# Patient Record
Sex: Male | Born: 1975 | Race: Black or African American | Hispanic: No | Marital: Single | Smoking: Never smoker
Health system: Southern US, Community
[De-identification: ages and names within clinical notes are randomized; demographics above are authoritative.]

## PROBLEM LIST (undated history)

## (undated) HISTORY — PX: NO PAST SURGERIES: SHX2092

---

## 2004-11-09 ENCOUNTER — Emergency Department (HOSPITAL_COMMUNITY): Admission: EM | Admit: 2004-11-09 | Discharge: 2004-11-09 | Payer: Self-pay | Admitting: Emergency Medicine

## 2018-08-10 ENCOUNTER — Ambulatory Visit (INDEPENDENT_AMBULATORY_CARE_PROVIDER_SITE_OTHER): Admitting: Urology

## 2018-08-10 ENCOUNTER — Encounter: Payer: Self-pay | Admitting: Urology

## 2018-08-10 ENCOUNTER — Telehealth: Payer: Self-pay | Admitting: Urology

## 2018-08-10 VITALS — BP 146/95 | HR 65 | Ht 71.0 in | Wt 208.7 lb

## 2018-08-10 DIAGNOSIS — Z3009 Encounter for other general counseling and advice on contraception: Secondary | ICD-10-CM

## 2018-08-10 MED ORDER — DIAZEPAM 10 MG PO TABS: 10.0000 mg | ORAL_TABLET | Freq: Four times a day (QID) | ORAL | 0 refills | Status: DC | PRN

## 2018-08-10 NOTE — Progress Notes (Signed)
08/10/2018 12:56 PM   Joshua Davidson 07/12/1966 [id number]  Referring provider: Bernerd Limbo, MD [address], [place] [address]  CC: Desire for vasectomy  HPI: I saw Joshua Davidson today in urology clinic in consultation for vasectomy from Dr. Coletta Memos.  He is a healthy 42 year old African-American male.  He is married with 4 children including a newborn.  Both he and his wife do not desire any further pregnancies.  He denies any history of urinary symptoms, erectile dysfunction, or gross hematuria.  He works in [employer].  He denies any prior surgeries.  There are no aggravating or alleviating factors.   PMH: No past medical history on file.  Surgical History: None  Home Medications:  Allergies as of 08/10/2018   No Known Allergies     Medication List    as of 08/10/2018 12:56 PM   You have not been prescribed any medications.     Allergies: No Known Allergies  Family History: No family history on file.  Social History:  reports that he has never smoked. He has never used smokeless tobacco. He reports that he drank alcohol. He reports that he does not use drugs.  ROS: Please see flowsheet from today's date for complete review of systems.  Physical Exam: BP (!) 146/95 (BP Location: Left Arm, Patient Position: Sitting, Cuff Size: Large)   Pulse 65   Ht 5' 11"  (1.803 m)   Wt 208 lb 11.2 oz (94.7 kg)   BMI 29.11 kg/m    Constitutional:  Alert and oriented, No acute distress. Cardiovascular: No clubbing, cyanosis, or edema. Respiratory: Normal respiratory effort, no increased work of breathing. GI: Abdomen is soft, nontender, nondistended, no abdominal masses GU: No CVA tenderness, phallus without lesions, widely patent meatus Left varicocele.  Testicles descended and 20 cc bilaterally without masses.  Vas deferens palpable bilaterally Lymph: No cervical or inguinal lymphadenopathy. Skin: No rashes, bruises or suspicious lesions. Neurologic:  Grossly intact, no focal deficits, moving all 4 extremities. Psychiatric: Normal mood and affect.  Laboratory Data: None to review  Pertinent Imaging: None to review  Assessment & Plan:   In summary, Joshua Davidson is a healthy 42 year old male with 4 children who desires vasectomy.  We discussed the risks and benefits of vasectomy at length.  Vasectomy is intended to be a permanent form of contraception, and does not produce immediate sterility.  Following vasectomy another form of contraception is required until vas occlusion is confirmed by a post-vasectomy semen analysis obtained 2-3 months after the procedure.  Even after vas occlusion is confirmed, vasectomy is not 100% reliable in preventing pregnancy, and the failure rate is approximately 09/1998.  Repeat vasectomy is required in less than 1% of patients.  He should refrain from ejaculation for 1 week after vasectomy.  Options for fertility after vasectomy include vasectomy reversal, and sperm retrieval with in vitro fertilization or ICSI.  These options are not always successful and may be expensive.  Finally, there are other permanent and non-permanent alternatives to vasectomy available. There is no risk of erectile dysfunction, and the volume of semen will be similar to prior, as the majority of the ejaculate is from the prostate and seminal vesicles.   The procedure takes ~20 minutes.  We recommend patients take 5-10 mg of Valium 30 minutes prior, and he will need a driver post-procedure.  Local anesthetic is injected into the scrotal skin and a small segment of the vas deferens is removed, and the ends occluded. The  complication rate is approximately 1-2%, and includes bleeding, infection, and development of chronic scrotal pain.  PLAN: Pending insurance approval, will schedule for vasectomy at his convenience   A total of 30 minutes were spent face-to-face with the patient, greater than 50% was spent in patient education, counseling, and  coordination of care regarding vasectomy and peri-and post procedural expectations.  Billey Co, [facility] [address] [place], [place] [address] [phone]

## 2018-08-10 NOTE — Telephone Encounter (Signed)
Spoke to patient and RX is up front

## 2018-08-10 NOTE — Telephone Encounter (Signed)
Rx cannot be e-scribed. It is up front for him to pick up. Thanks

## 2018-08-10 NOTE — Telephone Encounter (Signed)
Pt scheduled vasectomy for 09/15/18 and would like a valium sent to [facility] on [address] in [place].

## 2018-09-15 ENCOUNTER — Encounter: Payer: Self-pay | Admitting: Urology

## 2018-09-15 ENCOUNTER — Ambulatory Visit (INDEPENDENT_AMBULATORY_CARE_PROVIDER_SITE_OTHER): Admitting: Urology

## 2018-09-15 VITALS — BP 147/101 | HR 70 | Wt 210.0 lb

## 2018-09-15 DIAGNOSIS — Z302 Encounter for sterilization: Secondary | ICD-10-CM

## 2018-09-15 NOTE — Progress Notes (Signed)
VASECTOMY PROCEDURE NOTE:  The patient was taken to the minor procedure room and placed in the supine position. His genitals were prepped and draped in the usual sterile fashion. The right vas deferens was brought up to the skin of the right upper scrotum. The skin overlying it was anesthetized with 1% lidocaine without epinephrine, anesthetic was also injected alongside the vas deferens in the direction of the inguinal canal. The no scalpel vasectomy instrument was used to make a small perforation in the scrotal skin. The vasectomy clamp was used to grasp the vas deferens. It was carefully dissected free from surrounding structures. A 1cm segment of the vas was removed, and the cut ends of the mucosa were cauterized. No significant bleeding was noted. The vas deferens was returned to the scrotum. The skin incision was closed with a simple interrupted stitch of 4-0 chromic.  Attention was then turned to the left side. The left vasectomy was performed in the same exact fashion. Sterile dressings were placed over each incision. The patient tolerated the procedure well.  IMPRESSION/DIAGNOSIS: The patient is a 43 year old gentleman who underwent a vasectomy today. Post-procedure instructions were reviewed. I stressed the importance of continuing to use birth control until he provides a semen specimen more than 2 months from now that demonstrates azoospermia.  We discussed return precautions including fever over 101, significant bleeding or hematoma, or uncontrolled pain. I also stressed the importance of avoiding strenuous activity for one week, no sexual activity or ejaculations for 5 days, intermittent icing over the next 48 hours, and scrotal support.   PLAN: The patient will be advised of his semen analysis results when available.  Nickolas Madrid, MD 09/15/2018

## 2018-09-26 ENCOUNTER — Emergency Department (HOSPITAL_COMMUNITY)
Admission: EM | Admit: 2018-09-26 | Discharge: 2018-09-27 | Disposition: A | Discharge status: Home / Self Care | Attending: Emergency Medicine | Admitting: Emergency Medicine

## 2018-09-26 ENCOUNTER — Other Ambulatory Visit: Payer: Self-pay

## 2018-09-26 ENCOUNTER — Emergency Department (HOSPITAL_COMMUNITY)

## 2018-09-26 ENCOUNTER — Encounter (HOSPITAL_COMMUNITY): Payer: Self-pay

## 2018-09-26 DIAGNOSIS — G8918 Other acute postprocedural pain: Secondary | ICD-10-CM | POA: Diagnosis not present

## 2018-09-26 DIAGNOSIS — N50812 Left testicular pain: Secondary | ICD-10-CM | POA: Diagnosis present

## 2018-09-26 LAB — COMPREHENSIVE METABOLIC PANEL
ALT: 36 U/L (ref 0–44)
AST: 27 U/L (ref 15–41)
Albumin: 4.4 g/dL (ref 3.5–5.0)
Alkaline Phosphatase: 71 U/L (ref 38–126)
Anion gap: 7 (ref 5–15)
BUN: 17 mg/dL (ref 6–20)
CHLORIDE: 102 mmol/L (ref 98–111)
CO2: 29 mmol/L (ref 22–32)
Calcium: 9.3 mg/dL (ref 8.9–10.3)
Creatinine, Ser: 1.15 mg/dL (ref 0.61–1.24)
GFR calc Af Amer: >60 mL/min (ref >60)
GFR calc non Af Amer: >60 mL/min (ref >60)
Glucose, Bld: 83 mg/dL (ref 70–99)
Potassium: 3.8 mmol/L (ref 3.5–5.1)
Sodium: 138 mmol/L (ref 135–145)
Total Bilirubin: 1.4 mg/dL — ABNORMAL HIGH (ref 0.3–1.2)
Total Protein: 7.6 g/dL (ref 6.5–8.1)

## 2018-09-26 LAB — CBC WITH DIFFERENTIAL/PLATELET
Abs Immature Granulocytes: 0.03 10*3/uL (ref 0.00–0.07)
Basophils Absolute: 0 10*3/uL (ref 0.0–0.1)
Basophils Relative: 1 %
Eosinophils Absolute: 0 10*3/uL (ref 0.0–0.5)
Eosinophils Relative: 0 %
HCT: 42.5 % (ref 39.0–52.0)
Hemoglobin: 12.9 g/dL — ABNORMAL LOW (ref 13.0–17.0)
Immature Granulocytes: 0 %
Lymphocytes Relative: 17 %
Lymphs Abs: 1.5 10*3/uL (ref 0.7–4.0)
MCH: 28.7 pg (ref 26.0–34.0)
MCHC: 30.4 g/dL (ref 30.0–36.0)
MCV: 94.7 fL (ref 80.0–100.0)
Monocytes Absolute: 0.9 10*3/uL (ref 0.1–1.0)
Monocytes Relative: 11 %
Neutro Abs: 6.2 10*3/uL (ref 1.7–7.7)
Neutrophils Relative %: 71 %
Platelets: 263 10*3/uL (ref 150–400)
RBC: 4.49 MIL/uL (ref 4.22–5.81)
RDW: 13 % (ref 11.5–15.5)
WBC: 8.7 10*3/uL (ref 4.0–10.5)
nRBC: 0 % (ref 0.0–0.2)

## 2018-09-26 LAB — URINALYSIS, ROUTINE W REFLEX MICROSCOPIC
Bilirubin Urine: NEGATIVE
Glucose, UA: NEGATIVE mg/dL
Hgb urine dipstick: NEGATIVE
KETONES UR: NEGATIVE mg/dL
Leukocytes, UA: NEGATIVE
Nitrite: NEGATIVE
Protein, ur: NEGATIVE mg/dL
Specific Gravity, Urine: 1.027 (ref 1.005–1.030)
pH: 5 (ref 5.0–8.0)

## 2018-09-26 LAB — LACTIC ACID, PLASMA: LACTIC ACID, VENOUS: 0.9 mmol/L (ref 0.5–1.9)

## 2018-09-26 NOTE — ED Provider Notes (Signed)
[facility] DEPT Provider Note   CSN: [id number] Arrival date & time: 09/26/18  2110     History   Chief Complaint Chief Complaint  Patient presents with  . Post-op Problem    HPI Joshua Davidson is a 43 y.o. male.  43 year old male presents to the emergency department for evaluation of scrotal discomfort.  He is status post vasectomy on 09/15/2018.  Applied ice for approximately 24 hours postop, has not had any strenuous activity or heavy lifting, but has noticed some increasing discomfort in his left hemiscrotum.  Discomfort constant and associated with some swelling.  He has not taken any medications for symptoms.  The patient has felt chills with no documented fever. No sexual intercourse since his procedure. He further denies dysuria, problems voiding, redness or purulence from the area, abdominal pain, vomiting.  Urologist - Dr. Diamantina Providence  The history is provided by the patient. No language interpreter was used.    History reviewed. No pertinent past medical history.  There are no active problems to display for this patient.   Past Surgical History:  Procedure Laterality Date  . NO PAST SURGERIES          Home Medications    Prior to Admission medications   Medication Sig Start Date End Date Taking? Authorizing Provider  diazepam (VALIUM) 10 MG tablet Take 1 tablet (10 mg total) by mouth every 6 (six) hours as needed for anxiety. 08/10/18   Billey Co, MD  doxycycline (VIBRAMYCIN) 100 MG capsule Take 1 capsule (100 mg total) by mouth 2 (two) times daily. One po bid x 7 days 09/27/18   Sherwood Gambler, MD    Family History History reviewed. No pertinent family history.  Social History Social History   Tobacco Use  . Smoking status: Never Smoker  . Smokeless tobacco: Never Used  Substance Use Topics  . Alcohol use: Not Currently    Frequency: Never  . Drug use: Never     Allergies   Patient has no known  allergies.   Review of Systems Review of Systems Ten systems reviewed and are negative for acute change, except as noted in the HPI.    Physical Exam Updated Vital Signs BP (!) 137/92 (BP Location: Left Arm)   Pulse (!) 107   Temp (!) 102.2 F (39 C) (Oral)   Resp 18   Ht 5' 11"  (1.803 m)   Wt 95.3 kg   SpO2 97%   BMI 29.29 kg/m   Physical Exam Vitals signs and nursing note reviewed.  Constitutional:      General: He is not in acute distress.    Appearance: He is well-developed. He is not diaphoretic.     Comments: Nontoxic appearing and in NAD  HENT:     Head: Normocephalic and atraumatic.  Eyes:     General: No scleral icterus.    Conjunctiva/sclera: Conjunctivae normal.  Neck:     Musculoskeletal: Normal range of motion.  Pulmonary:     Effort: Pulmonary effort is normal. No respiratory distress.     Comments: Respirations even and unlabored Genitourinary:    Comments: Exam chaperoned by Glennon Mac, Engineer, manufacturing. There is firmness to the superior left hemiscrotum with mild discomfort on palpation. Suspect hematoma. Does not appear to communicate with testicle. No fluctuance, erythema, heat to touch. Surgical incision sites appear well healed. No purulent drainage. Right hemiscrotum and surgical site appear normal. Musculoskeletal: Normal range of motion.  Skin:    General: Skin is  warm and dry.     Coloration: Skin is not pale.     Findings: No erythema or rash.  Neurological:     Mental Status: He is alert and oriented to person, place, and time.  Psychiatric:        Behavior: Behavior normal.      ED Treatments / Results  Labs (all labs ordered are listed, but only abnormal results are displayed) Labs Reviewed  COMPREHENSIVE METABOLIC PANEL - Abnormal; Notable for the following components:      Result Value   Total Bilirubin 1.4 (*)    All other components within normal limits  CBC WITH DIFFERENTIAL/PLATELET - Abnormal; Notable for the following components:    Hemoglobin 12.9 (*)    All other components within normal limits  LACTIC ACID, PLASMA  URINALYSIS, ROUTINE W REFLEX MICROSCOPIC    EKG None  Radiology US Scrotum  Result Date: 09/26/2018 CLINICAL DATA:  Left-sided scrotal pain recent cecectomy EXAM: ULTRASOUND OF SCROTUM TECHNIQUE: Complete ultrasound examination of the testicles, epididymis, and other scrotal structures was performed. COMPARISON:  None. FINDINGS: Right testicle Measurements: 3.7 x 2.3 x 2.6 cm. No mass or microlithiasis visualized. Left testicle Measurements: 3.7 x 2.8 x 2.7 cm. No mass or microlithiasis visualized. Right epididymis:  Normal in size and appearance. Left epididymis:  Slightly enlarged and heterogeneous in appearance. Hydrocele: Small bilateral hydroceles, slightly complex on the right. Varicocele:  Small left-sided varicocele IMPRESSION: 1. Slightly enlarged heterogeneous left epididymis, possible mild epididymitis. 2. Small bilateral hydroceles 3. Small left varicocele Electronically Signed   By: Donavan Foil M.D.   On: 09/26/2018 23:17    Procedures Procedures (including critical care time)  Medications Ordered in ED Medications  ibuprofen (ADVIL,MOTRIN) tablet 600 mg (600 mg Oral Given 09/27/18 0053)  doxycycline (VIBRA-TABS) tablet 100 mg (100 mg Oral Given 09/27/18 0053)     Initial Impression / Assessment and Plan / ED Course  I have reviewed the triage vital signs and the nursing notes.  Pertinent labs & imaging results that were available during my care of the patient were reviewed by me and considered in my medical decision making (see chart for details).     43 year old male presents to the emergency department for evaluation of subjective fever and chills.  Initially afebrile prior to arrival.  Noted to develop fever of 102.2 F at time of discharge.  Was given ibuprofen for this.  Is status post vasectomy on 09/15/2018.  Began noticing increasing discomfort in his left hemiscrotum.  On  exam, there is an area of firmness and induration without fluctuance.  No overlying erythema or heat to touch.  It does not appear to specifically communicate with his surgical incision site.  Exam favors hematoma, though ultrasound raises possibility for epididymitis.  Given fever, doxycycline initiated by my attending Dr. Regenia Skeeter.  His laboratory evaluation has been reassuring without leukocytosis or elevated lactate.  No bacteriuria or pyuria on urinalysis.  It is also possible that the patient's fever may be unrelated to his scrotal discomfort such as with a flulike illness or other viral etiology.  The patient has been encouraged to call the office of his urologist in the morning to schedule close follow-up.  Return precautions discussed and provided. Patient discharged in stable condition with no unaddressed concerns.   Final Clinical Impressions(s) / ED Diagnoses   Final diagnoses:  Post-operative pain    ED Discharge Orders         Ordered    doxycycline (VIBRAMYCIN)  100 MG capsule  2 times daily     09/27/18 0025           Antonietta Breach, PA-C 09/27/18 0454    Sherwood Gambler, MD 09/27/18 [phone]

## 2018-09-26 NOTE — ED Triage Notes (Signed)
Pt reports that he had a vasectomy on 1/15. He reports noticing swelling and increased pain in his scrotum yesterday. Pt also reports feeling chills starting yesterday. Denies fever, nausea or vomiting.

## 2018-09-26 NOTE — Discharge Instructions (Signed)
We recommend scrotal support and elevation as well as continued icing.  Take ibuprofen every 6 hours as needed for discomfort.  Follow-up with your urologist for reevaluation of the affected area.  You may return to the ED for new or concerning symptoms.

## 2018-09-27 MED ORDER — IBUPROFEN 200 MG PO TABS
600.0000 mg | ORAL_TABLET | Freq: Once | ORAL | Status: AC
  Administered 2018-09-27: 600 mg via ORAL
  Filled 2018-09-27: qty 3

## 2018-09-27 MED ORDER — DOXYCYCLINE HYCLATE 100 MG PO TABS
100.0000 mg | ORAL_TABLET | Freq: Once | ORAL | Status: AC
  Administered 2018-09-27: 100 mg via ORAL
  Filled 2018-09-27: qty 1

## 2018-09-27 MED ORDER — DOXYCYCLINE HYCLATE 100 MG PO CAPS: 100.0000 mg | ORAL_CAPSULE | Freq: Two times a day (BID) | ORAL | 0 refills | Status: AC

## 2018-09-27 NOTE — ED Notes (Signed)
Dr. Regenia Skeeter aware of temp previously charted and is at bedside with pt now.

## 2018-09-27 NOTE — ED Provider Notes (Signed)
Medical screening examination/treatment/procedure(s) were conducted as a shared visit with non-physician practitioner(s) and myself.  I personally evaluated the patient during the encounter.  None   Patient presents with swelling and firmness to his left hemiscrotum.  Mildly tender but not erythematous or warm.  WBC normal, lactate normal.  Ultrasound without obvious significant findings such as abscess or fluid collection.  He was to be discharged with supportive care and follow-up with his urologist tomorrow.  He then developed a temp of 102.  He still appears well, but I think at this point it would be reasonable to give him antibiotics.  He has no other signs or symptoms of infection such as cough.   Sherwood Gambler, MD 09/27/18 [phone]

## 2018-09-28 ENCOUNTER — Encounter: Payer: Self-pay | Admitting: Urology

## 2018-09-28 ENCOUNTER — Ambulatory Visit (INDEPENDENT_AMBULATORY_CARE_PROVIDER_SITE_OTHER): Admitting: Urology

## 2018-09-28 VITALS — BP 145/93 | HR 70 | Ht 71.0 in | Wt 214.0 lb

## 2018-09-28 DIAGNOSIS — L03818 Cellulitis of other sites: Secondary | ICD-10-CM

## 2018-09-29 NOTE — Progress Notes (Signed)
   09/28/2018 8:10 AM   Roslynn Amble Oct 05, 1975 [id number]  Reason for visit: Follow up post-vasectomy concerns  Mr. Dugo is a healthy 43 year old African-American male that underwent uncomplicated vasectomy on 09/15/2018.  He did well the first week post procedure, however then developed some left-sided scrotal pain and presented to the ER in the evening of 09/26/2018 with scrotal pain.  Work-up at that time with labs was benign, and scrotal ultrasound was essentially normal aside from possible left epididymitis.  No hematoma or abscess was seen.  He reportedly had a fever in the emergency department.  He was started on a weeklong course of doxycycline.  He reports he is felt better since starting the antibiotic in the last 48 hours.  He denies any recurrent fevers, drainage, erythema, nausea, or vomiting.  On exam, incisions are well-healed, there is no purulence or erythema, there is some thickening of the left scrotal skin surrounding the left-sided vasectomy incision concerning for possible superficial cellulitis.  Testicles are nontender bilaterally.   In summary, the patient is a 43 year old healthy male with likely superficial cellulitis of the left scrotum after vasectomy.  He is improving with oral doxycycline.  I discussed the importance of ongoing scrotal support, icing, rest, and completing his 1 week course of antibiotics.  Anticipate this will resolve over the next few days when he completes his course of antibiotics.  I encouraged him to call the clinic if he has worsening scrotal pain or fever.  Keep scheduled follow-up for semen analysis in 2 to 3 months Continue doxycycline x7 days, call if symptoms worsening  A total of 10 minutes were spent face-to-face with the patient, greater than 50% was spent in patient education, counseling, and coordination of care regarding postvasectomy concerns, and scrotal cellulitis.   Billey Co, [facility] [address], [place], [place] [address] [phone]

## 2018-12-03 ENCOUNTER — Other Ambulatory Visit: Payer: Self-pay

## 2018-12-03 DIAGNOSIS — Z3009 Encounter for other general counseling and advice on contraception: Secondary | ICD-10-CM

## 2018-12-09 ENCOUNTER — Other Ambulatory Visit

## 2018-12-09 ENCOUNTER — Encounter: Payer: Self-pay | Admitting: Urology

## 2018-12-10 ENCOUNTER — Other Ambulatory Visit

## 2018-12-10 LAB — POST-VAS SPERM EVALUATION,QUAL: Volume: 1.5 mL

## 2018-12-15 ENCOUNTER — Telehealth: Payer: Self-pay

## 2018-12-15 NOTE — Telephone Encounter (Signed)
-----   Message from Billey Co, MD sent at 12/13/2018 10:02 AM EDT ----- Sperm still present, encourage ejaculations to clear passage of sperm. Needs to repeat the more sensitive semen analysis in ~4-6 weeks.  Nickolas Madrid, MD 12/13/2018

## 2018-12-15 NOTE — Telephone Encounter (Signed)
Patient notified and order form filled and placed at front desk w/cup

## 2019-02-14 ENCOUNTER — Other Ambulatory Visit: Payer: Self-pay | Admitting: Urology

## 2019-02-15 LAB — POST-VAS SPERM EVALUATION,QUAL: Volume: 2.5 mL

## 2019-02-15 LAB — SPECIMEN STATUS REPORT

## 2019-02-22 ENCOUNTER — Telehealth: Payer: Self-pay

## 2019-02-22 NOTE — Telephone Encounter (Signed)
This was his 2nd post vas drop off, should he now do the time sensitive one?

## 2019-02-22 NOTE — Telephone Encounter (Signed)
-----   Message from Billey Co, MD sent at 02/21/2019  3:51 PM EDT ----- Did this patient get the more sensitive sperm analysis test? Or just 2 of the standard semen analyses?  Nickolas Madrid, MD 02/21/2019

## 2019-02-22 NOTE — Telephone Encounter (Signed)
Yes, the more sensitive one. He had an uncomplicated vasectomy, so I anticipate that one will show no motile sperm. Also encourage clearance of sperm w/ejaculations  Nickolas Madrid, MD 02/22/2019

## 2019-03-02 NOTE — Telephone Encounter (Signed)
-----   Message from Billey Co, MD sent at 02/24/2019 12:35 PM EDT ----- Regarding: sensitive semen analysis Did this patient get the more sensitive semen analysis yet?  Thanks Nickolas Madrid, MD 02/24/2019  ----- Message ----- From: Interface, [facility] Lab Results In Sent: 02/21/2019   3:47 PM EDT To: Billey Co, MD

## 2019-03-10 ENCOUNTER — Telehealth: Payer: Self-pay | Admitting: Urology

## 2019-03-10 NOTE — Telephone Encounter (Signed)
Pt states that he did a formal semen analysis at [facility] a couple of weeks ago, he wasn't sure if we would call him back with those results or [facility]. Please advise.

## 2019-03-10 NOTE — Telephone Encounter (Signed)
Spoke with patient and notified him that the lab ran the incorrect test. They state that the test ordered is discontinued now and we have a call into our [facility] rep to resolve this. We will contact patient when we have a answer for the test to be repeated

## 2019-03-24 NOTE — Telephone Encounter (Signed)
Spoke with patient and notified him that the lab has confirmed that the order is still in place and the confusion has been resolved and is ok to reorder for the timed drop off post vas. Order placed up front for patient to pick up

## 2019-04-20 ENCOUNTER — Other Ambulatory Visit: Payer: Self-pay | Admitting: Urology

## 2019-04-21 LAB — POST VAS SEMEN ANALYSIS, AUA
Total Immotile Sperm: 0.03 x10E6/mL (ref <0.10)
Volume: 2.3 mL

## 2019-05-02 ENCOUNTER — Telehealth: Payer: Self-pay | Admitting: Urology

## 2019-05-02 NOTE — Telephone Encounter (Signed)
Pt called office asking about results for formal semen analysis that he did a couple of weeks ago.

## 2019-05-02 NOTE — Telephone Encounter (Signed)
Patient informed-verbalized understanding. Will call him next week.

## 2019-05-02 NOTE — Telephone Encounter (Signed)
Joshua Davidson was trying to clarify with [facility] bc it was reported in an odd way. But there were no mobile sperm seen per the report. Just tell him we will get back to him next week when she is back from vacation for 100% confirmation that he can stop using alternative contraception.  Nickolas Madrid, MD 05/02/2019

## 2019-05-13 ENCOUNTER — Other Ambulatory Visit: Payer: Self-pay | Admitting: Urology

## 2019-05-13 NOTE — Telephone Encounter (Signed)
Spoke with Joshua Davidson at [facility] and confirmed that the motile sperm was negative and requested a new report be sent over confirming this. Patient was notified on Vmail per DPR that semen analysis was clear no motile sperm seen and non motile was less than 100,000 per AUA guidelines ok to use VAS as primary source of contraception

## 2019-07-01 IMAGING — US US SCROTUM
1 series · 14 of 25 positions shown · non-contrast
Comparison: None.

CLINICAL DATA: Left-sided scrotal pain recent cecectomy

EXAM:
ULTRASOUND OF SCROTUM
TECHNIQUE: Complete ultrasound examination of the testicles, epididymis, and
other scrotal structures was performed.

[Series 1: us scrotum · 0.07mm/px · 29 acquisitions, 14 frames shown]
[im 1/29]
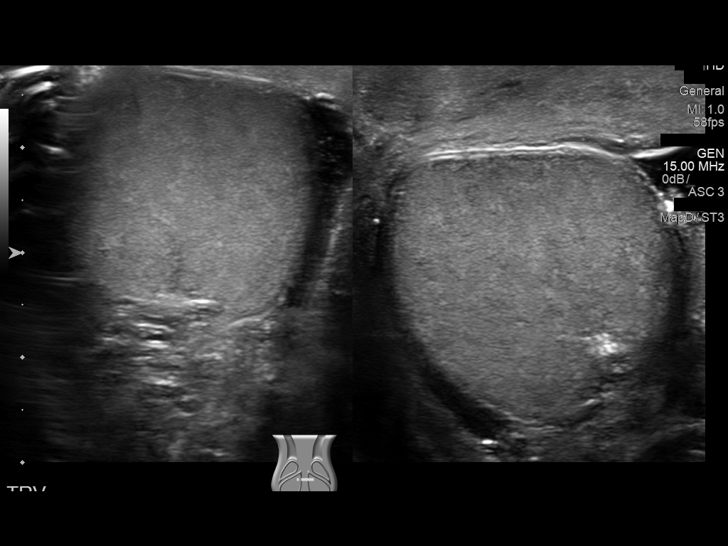
[im 3/29]
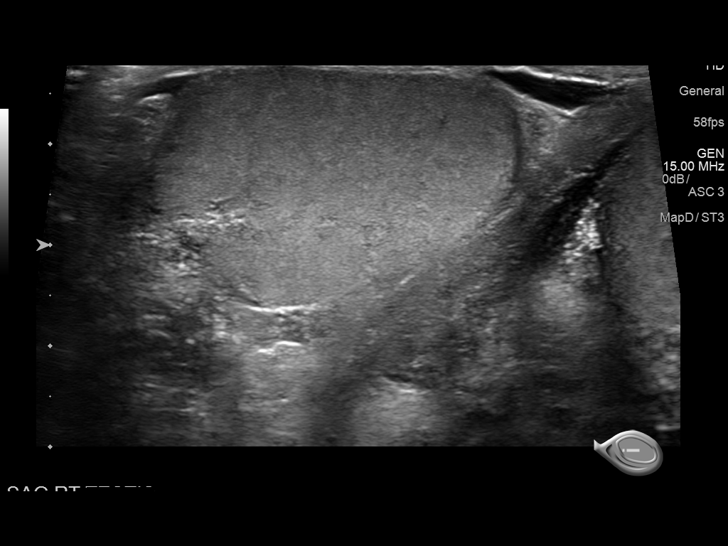
[im 5/29]
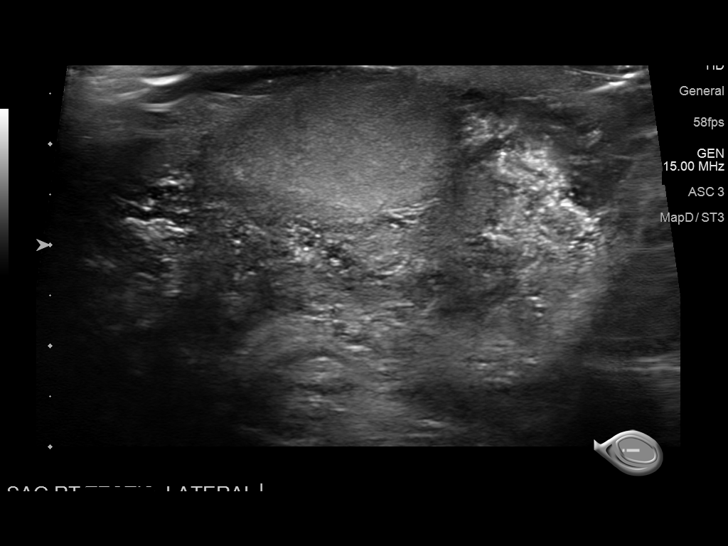
[im 8/29]
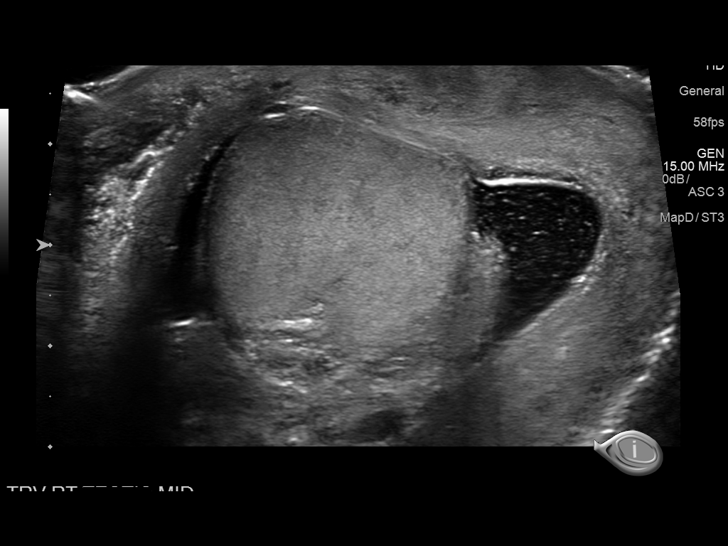
[im 10/29]
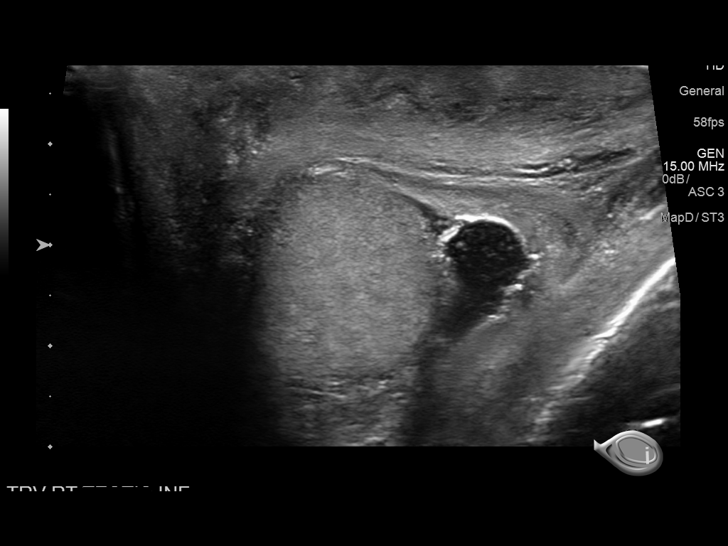
[im 11/29]
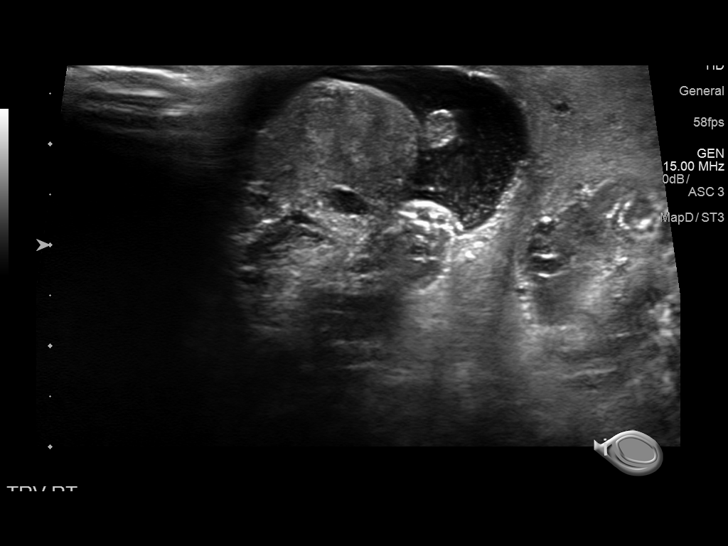
[im 13/29]
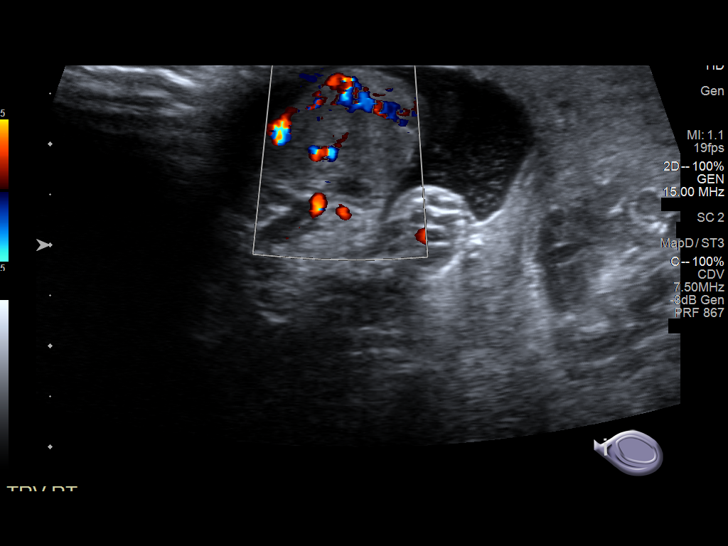
[im 16/29]
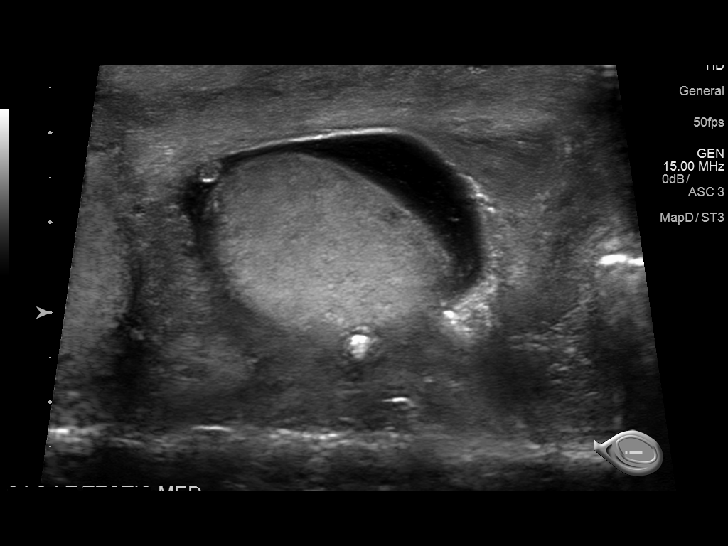
[im 18/29]
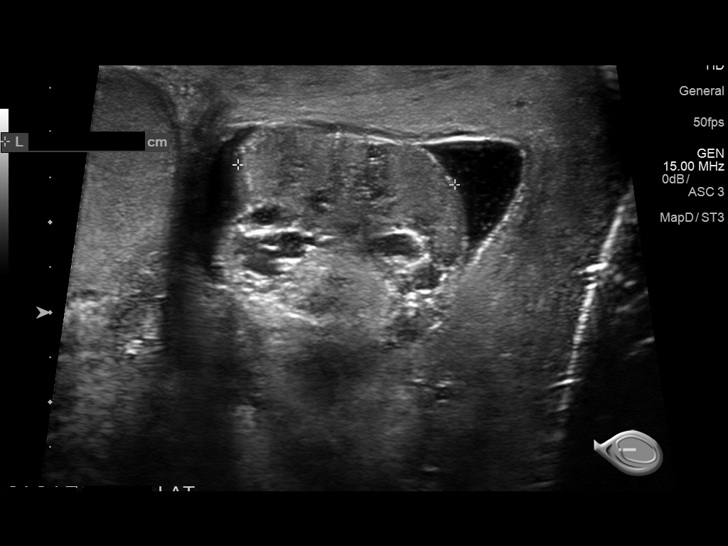
[im 19/29]
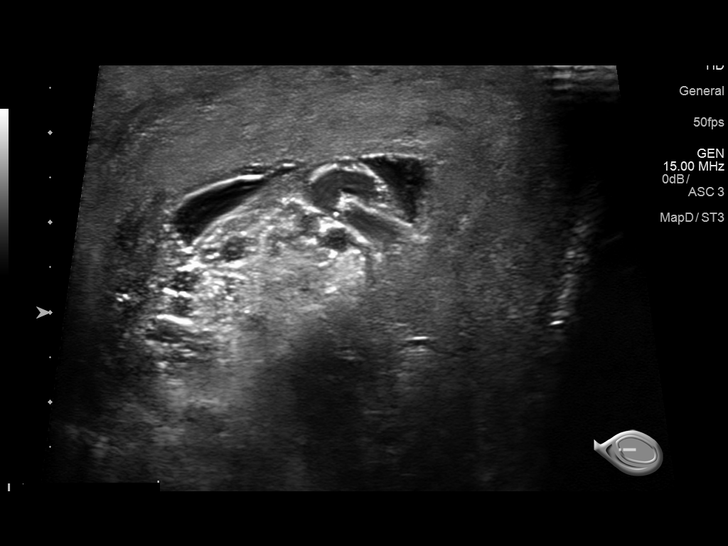
[im 22/29]
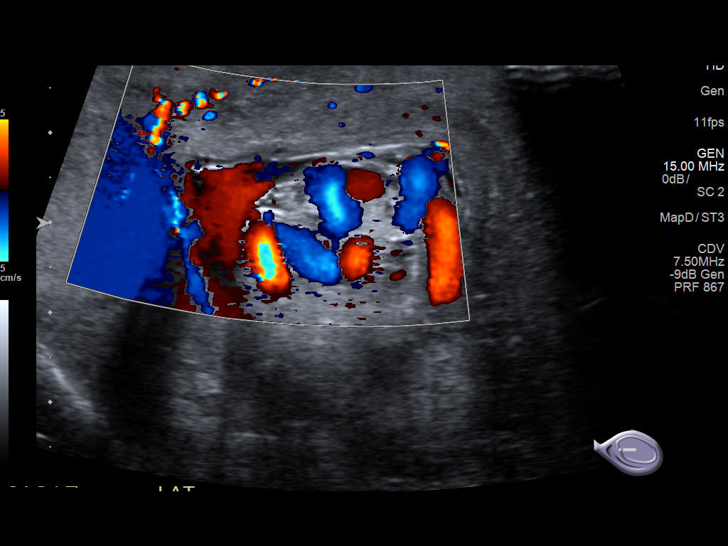
[im 24/29]
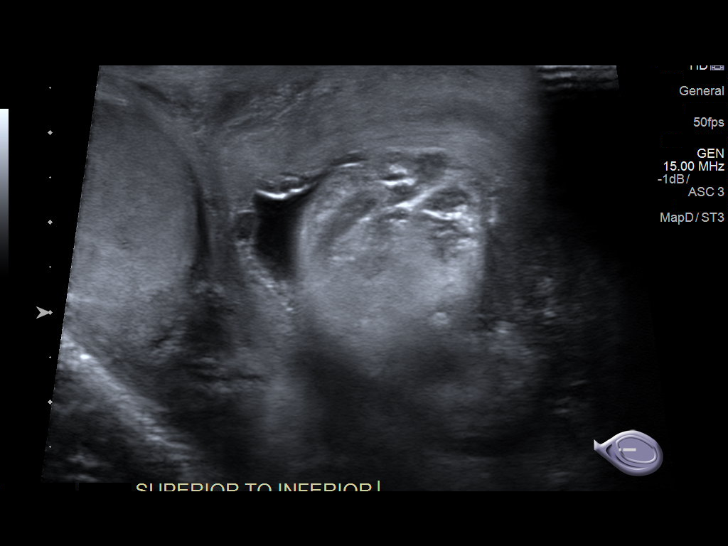
[im 26/29]
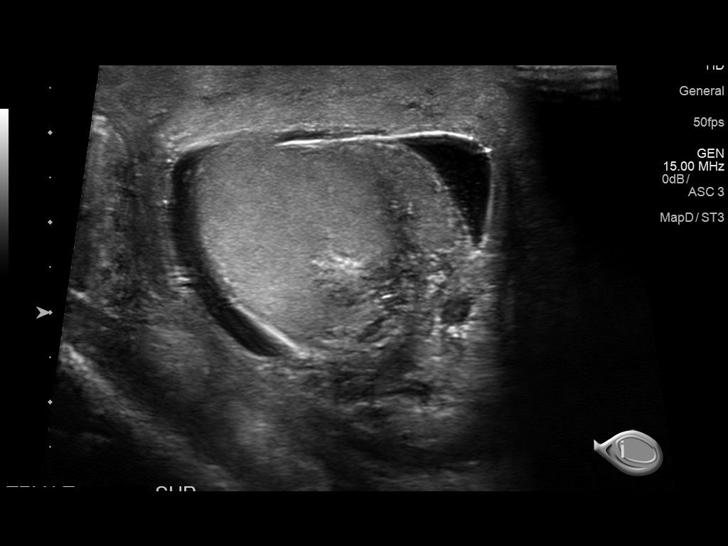
[im 29/29]
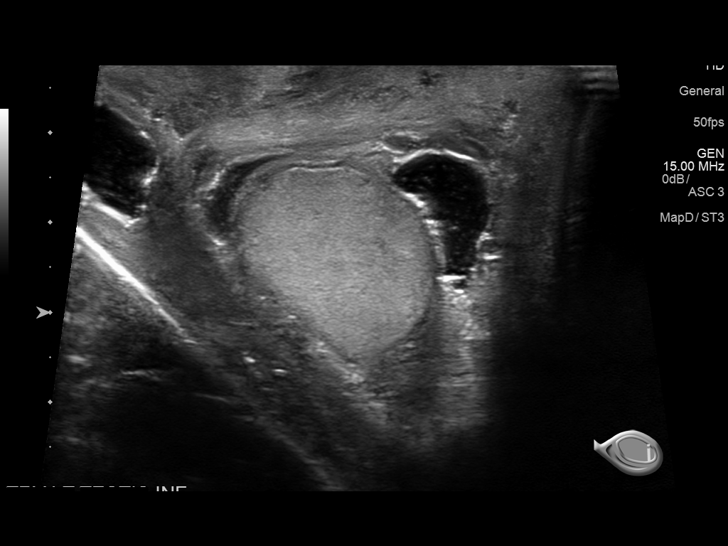

[14 of 25 positions shown; findings below may reference images not displayed]

FINDINGS: Right testicle

Measurements: 3.7 x 2.3 x 2.6 cm. No mass or microlithiasis
visualized.

Left testicle

Measurements: 3.7 x 2.8 x 2.7 cm. No mass or microlithiasis
visualized.

Right epididymis:  Normal in size and appearance.

Left epididymis:  Slightly enlarged and heterogeneous in appearance.

Hydrocele: Small bilateral hydroceles, slightly complex on the
right.

Varicocele:  Small left-sided varicocele
IMPRESSION: 1. Slightly enlarged heterogeneous left epididymis, possible mild
epididymitis.
2. Small bilateral hydroceles
3. Small left varicocele
